# Patient Record
Sex: Female | Born: 1995 | Race: White | Hispanic: No | Marital: Single | Smoking: Never smoker
Health system: Southern US, Community
[De-identification: ages and names within clinical notes are randomized; demographics above are authoritative.]

## PROBLEM LIST (undated history)

## (undated) DIAGNOSIS — Z87440 Personal history of urinary (tract) infections: Secondary | ICD-10-CM

## (undated) HISTORY — DX: Personal history of urinary (tract) infections: Z87.440

## (undated) HISTORY — PX: NO PAST SURGERIES: SHX2092

---
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
## 2016-12-05 ENCOUNTER — Ambulatory Visit: Payer: Self-pay | Admitting: Maternal Newborn

## 2016-12-05 ENCOUNTER — Encounter: Payer: Self-pay | Admitting: Maternal Newborn

## 2017-02-01 ENCOUNTER — Encounter: Payer: Self-pay | Admitting: Maternal Newborn

## 2017-02-01 ENCOUNTER — Ambulatory Visit: Payer: Commercial Managed Care - PPO | Admitting: Maternal Newborn
